# Patient Record
Sex: Male | Born: 1975 | Race: Black or African American | Hispanic: No | Marital: Married | Smoking: Never smoker
Health system: Southern US, Community
[De-identification: ages and names within clinical notes are randomized; demographics above are authoritative.]

## PROBLEM LIST (undated history)

## (undated) DIAGNOSIS — F419 Anxiety disorder, unspecified: Secondary | ICD-10-CM

---

## 2015-08-06 ENCOUNTER — Encounter (HOSPITAL_COMMUNITY): Payer: Self-pay | Admitting: Emergency Medicine

## 2015-08-06 ENCOUNTER — Emergency Department (HOSPITAL_COMMUNITY)
Admission: EM | Admit: 2015-08-06 | Discharge: 2015-08-06 | Disposition: A | Discharge status: Home / Self Care | Payer: No Typology Code available for payment source | Attending: Emergency Medicine | Admitting: Emergency Medicine

## 2015-08-06 ENCOUNTER — Emergency Department (HOSPITAL_COMMUNITY): Payer: No Typology Code available for payment source

## 2015-08-06 DIAGNOSIS — S39012A Strain of muscle, fascia and tendon of lower back, initial encounter: Secondary | ICD-10-CM

## 2015-08-06 DIAGNOSIS — Y998 Other external cause status: Secondary | ICD-10-CM | POA: Insufficient documentation

## 2015-08-06 DIAGNOSIS — Z8659 Personal history of other mental and behavioral disorders: Secondary | ICD-10-CM | POA: Insufficient documentation

## 2015-08-06 DIAGNOSIS — Z880 Allergy status to penicillin: Secondary | ICD-10-CM | POA: Diagnosis not present

## 2015-08-06 DIAGNOSIS — S3992XA Unspecified injury of lower back, initial encounter: Secondary | ICD-10-CM | POA: Diagnosis present

## 2015-08-06 DIAGNOSIS — S199XXA Unspecified injury of neck, initial encounter: Secondary | ICD-10-CM | POA: Insufficient documentation

## 2015-08-06 DIAGNOSIS — S4992XA Unspecified injury of left shoulder and upper arm, initial encounter: Secondary | ICD-10-CM | POA: Insufficient documentation

## 2015-08-06 DIAGNOSIS — Y92410 Unspecified street and highway as the place of occurrence of the external cause: Secondary | ICD-10-CM | POA: Diagnosis not present

## 2015-08-06 DIAGNOSIS — Y9389 Activity, other specified: Secondary | ICD-10-CM | POA: Insufficient documentation

## 2015-08-06 HISTORY — DX: Anxiety disorder, unspecified: F41.9

## 2015-08-06 MED ORDER — IBUPROFEN 800 MG PO TABS: 800.0000 mg | ORAL_TABLET | Freq: Three times a day (TID) | ORAL | Status: AC

## 2015-08-06 MED ORDER — HYDROCODONE-ACETAMINOPHEN 5-325 MG PO TABS: 1.0000 | ORAL_TABLET | ORAL | Status: AC | PRN

## 2015-08-06 MED ORDER — HYDROCODONE-ACETAMINOPHEN 5-325 MG PO TABS
1.0000 | ORAL_TABLET | Freq: Once | ORAL | Status: AC
  Administered 2015-08-06: 1 via ORAL
  Filled 2015-08-06: qty 1

## 2015-08-06 MED ORDER — IBUPROFEN 800 MG PO TABS
800.0000 mg | ORAL_TABLET | Freq: Once | ORAL | Status: AC
  Administered 2015-08-06: 800 mg via ORAL
  Filled 2015-08-06: qty 1
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
## 2015-08-06 NOTE — ED Notes (Signed)
Patient walked in hallway to bathroom, patient is eating crackers and drinking water.

## 2015-08-06 NOTE — ED Notes (Signed)
Pt refused vitals until he speaks to an attorney

## 2015-08-06 NOTE — Discharge Instructions (Signed)
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
## 2018-07-04 ENCOUNTER — Telehealth: Payer: Self-pay | Admitting: Orthopedic Surgery
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
## 2020-04-15 ENCOUNTER — Ambulatory Visit: Payer: HRSA Program | Attending: Internal Medicine

## 2020-04-15 ENCOUNTER — Other Ambulatory Visit: Payer: Self-pay

## 2020-04-15 DIAGNOSIS — Z20822 Contact with and (suspected) exposure to covid-19: Secondary | ICD-10-CM | POA: Insufficient documentation

## 2020-04-16 LAB — SARS-COV-2, NAA 2 DAY TAT

## 2020-04-16 LAB — NOVEL CORONAVIRUS, NAA: SARS-CoV-2, NAA: NOT DETECTED
